# Patient Record
Sex: Male | Born: 1975 | Hispanic: Yes | Marital: Married | Smoking: Never smoker
Health system: Southern US, Community
[De-identification: ages and names within clinical notes are randomized; demographics above are authoritative.]

---

## 2017-12-19 ENCOUNTER — Emergency Department (HOSPITAL_BASED_OUTPATIENT_CLINIC_OR_DEPARTMENT_OTHER)
Admission: EM | Admit: 2017-12-19 | Discharge: 2017-12-19 | Disposition: A | Discharge status: Home / Self Care | Payer: Self-pay | Attending: Emergency Medicine | Admitting: Emergency Medicine

## 2017-12-19 ENCOUNTER — Other Ambulatory Visit: Payer: Self-pay

## 2017-12-19 ENCOUNTER — Encounter (HOSPITAL_BASED_OUTPATIENT_CLINIC_OR_DEPARTMENT_OTHER): Payer: Self-pay

## 2017-12-19 ENCOUNTER — Emergency Department (HOSPITAL_BASED_OUTPATIENT_CLINIC_OR_DEPARTMENT_OTHER): Payer: Self-pay

## 2017-12-19 DIAGNOSIS — N503 Cyst of epididymis: Secondary | ICD-10-CM

## 2017-12-19 DIAGNOSIS — N433 Hydrocele, unspecified: Secondary | ICD-10-CM

## 2017-12-19 DIAGNOSIS — N50811 Right testicular pain: Secondary | ICD-10-CM

## 2017-12-19 LAB — URINALYSIS, ROUTINE W REFLEX MICROSCOPIC
BILIRUBIN URINE: NEGATIVE
Glucose, UA: NEGATIVE mg/dL
HGB URINE DIPSTICK: NEGATIVE
Ketones, ur: NEGATIVE mg/dL
Leukocytes, UA: NEGATIVE
Nitrite: NEGATIVE
PH: 7 (ref 5.0–8.0)
PROTEIN: NEGATIVE mg/dL
SPECIFIC GRAVITY, URINE: 1.015 (ref 1.005–1.030)

## 2017-12-19 NOTE — ED Triage Notes (Signed)
Thru interpreter Melanee Spry [phone] with right testicle pain x 23 monthe-was seen once for pain and given pain meds-has had increase in pain today-denies dysuria and penile d/c-has noticed increase in urination-pt NAD-steady gait

## 2017-12-19 NOTE — ED Provider Notes (Signed)
[facility] EMERGENCY DEPARTMENT Provider Note   CSN: [id number] Arrival date & time: 12/19/17  1244     History   Chief Complaint Chief Complaint  Patient presents with  . Testicle Pain   [employer] interpreter used to obtain history HPI Benjamin Payne is a 42 y.o. male with no significant past medical history presents emergency department today for right testicular pain over the last 2-3 months.  Patient states that he typically gets daily testicular pain on the right side that occurs later in the afternoon after he is finished work.  He notes that the pain typically lasts for 1-2 hours and describes as a dull, achy, throbbing pain.  He does nothing makes his symptoms better or worse.  He has not tried anything for this.  Patient is currently sexually active with one male partner, his wife, he does not use protection.  He denies history of STDs or concern for STDs at this time.  He denies any fever, chills, abdominal pain, nausea/vomiting/diarrhea, painful bowel movements, penile discharge, bleeding or bleeding, rashes or lesions.  HPI  History reviewed. No pertinent past medical history.  There are no active problems to display for this patient.   History reviewed. No pertinent surgical history.      Home Medications    Prior to Admission medications   Not on File    Family History No family history on file.  Social History Social History   Tobacco Use  . Smoking status: Never Smoker  . Smokeless tobacco: Never Used  Substance Use Topics  . Alcohol use: Not Currently  . Drug use: Never     Allergies   Patient has no known allergies.   Review of Systems Review of Systems  All other systems reviewed and are negative.    Physical Exam Updated Vital Signs BP 119/68 (BP Location: Left Arm)   Pulse (!) 58   Temp 98.2 F (36.8 C) (Oral)   Resp 18   Wt 58.7 kg (129 lb 6.6 oz)   SpO2 100%   Physical Exam  Constitutional: He appears  well-developed and well-nourished.  HENT:  Head: Normocephalic and atraumatic.  Right Ear: External ear normal.  Left Ear: External ear normal.  Nose: Nose normal.  Mouth/Throat: Uvula is midline, oropharynx is clear and moist and mucous membranes are normal. No tonsillar exudate.  Eyes: Pupils are equal, round, and reactive to light. Right eye exhibits no discharge. Left eye exhibits no discharge. No scleral icterus.  Neck: Trachea normal. Neck supple. No spinous process tenderness present. No neck rigidity. Normal range of motion present.  Cardiovascular: Normal rate, regular rhythm and intact distal pulses.  No murmur heard. Pulses:      Radial pulses are 2+ on the right side, and 2+ on the left side.       Dorsalis pedis pulses are 2+ on the right side, and 2+ on the left side.       Posterior tibial pulses are 2+ on the right side, and 2+ on the left side.  No lower extremity swelling or edema. Calves symmetric in size bilaterally.  Pulmonary/Chest: Effort normal and breath sounds normal. He exhibits no tenderness.  Abdominal: Soft. Bowel sounds are normal. There is no tenderness. There is no rigidity, no rebound, no guarding and no CVA tenderness. Hernia confirmed negative in the right inguinal area and confirmed negative in the left inguinal area.  Genitourinary: Testes normal and penis normal. Right testis shows no mass, no swelling  and no tenderness. Left testis shows no mass, no swelling and no tenderness. Uncircumcised. No phimosis, paraphimosis, hypospadias, penile erythema or penile tenderness. No discharge found.  Genitourinary Comments: Chaperone present during genital exam. No external genital lesions noted, no bumps on head of penis, specifically no vesicles concerning for herpes or chancre suggestive of syphillis, no pain with palpation, no discharge or urethritis noted, scrotum and testicles w/o erythema or swelling, NTTP   Musculoskeletal: He exhibits no edema.    Lymphadenopathy:    He has no cervical adenopathy. No inguinal adenopathy noted on the right or left side.  Neurological: He is alert.  Skin: Skin is warm and dry. No rash noted. He is not diaphoretic.  Psychiatric: He has a normal mood and affect.  Nursing note and vitals reviewed.    ED Treatments / Results  Labs (all labs ordered are listed, but only abnormal results are displayed) Labs Reviewed  URINALYSIS, ROUTINE W REFLEX MICROSCOPIC  GC/CHLAMYDIA PROBE AMP ([place]) NOT AT [facility]    EKG None  Radiology US Scrotum W/doppler  Result Date: 12/19/2017 CLINICAL DATA:  Chronic RIGHT testicular pain increased today EXAM: SCROTAL ULTRASOUND DOPPLER ULTRASOUND OF THE TESTICLES TECHNIQUE: Complete ultrasound examination of the testicles, epididymis, and other scrotal structures was performed. Color and spectral Doppler ultrasound were also utilized to evaluate blood flow to the testicles. COMPARISON:  None FINDINGS: Right testicle Measurements: 5.1 x 2.5 x 3.7 cm. Normal morphology without mass or calcification. Internal blood flow present on color Doppler imaging. Left testicle Measurements: 5.1 x 2.3 x 3.1 cm. Normal morphology without mass or calcification. Internal blood flow present on color Doppler imaging. Right epididymis:  Normal in size and appearance. Left epididymis:  Tiny cyst at LEFT epididymal head 3 mm diameter Hydrocele:  Minimal LEFT hydrocele.  No RIGHT hydrocele Varicocele:  Absent bilaterally Pulsed Doppler interrogation of both testes demonstrates normal low resistance arterial and venous waveforms bilaterally. IMPRESSION: Minimal LEFT hydrocele and tiny LEFT epididymal head cyst 3 mm diameter. Otherwise normal exam. Electronically Signed   By: Ulyses Southward M.D.   On: 12/19/2017 15:26    Procedures Procedures (including critical care time)  Medications Ordered in ED Medications - No data to display   Initial Impression / Assessment and Plan / ED Course  I have  reviewed the triage vital signs and the nursing notes.  Pertinent labs & imaging results that were available during my care of the patient were reviewed by me and considered in my medical decision making (see chart for details).     42 year old male with intermittent right testicular pain over the last 2-3 months.  He notes that he gets this pain daily after he finishes work and it typically last 1-2 hours.  He notes some occasional swelling with this but no overlying erythema.  He denies any abdominal pain, fever, nausea/vomiting, painful bowel movements, pedal discharge.  He is sexually active in a monogamous relationship with his wife and does not use protection.  He has no concerns for STDs at this time.  Patient abdominal exam benign without any tenderness palpation or peritoneal signs.  GU exam reassuring as above.  There is no tenderness palpation of the testes or epididymis to suggest orchitis or epididymitis.  Patient denies any painful bowel movements of making concern for prostatitis.  Urinalysis without evidence of UTI.  Scrotal ultrasound without evidence of torsion.  There is minimal left hydrocele and tiny left epididymal head cysts measuring 3 mm. No abnormalities of the  right testicle where the patient is having pain. G/C collected and patient told he will be called with results. The evaluation does not show pathology that would require ongoing emergent intervention or inpatient treatment. I advised the patient to call for follow-up with Urologist this week. I advised the patient to return to the emergency department with new or worsening symptoms or new concerns. Specific return precautions discussed. The patient verbalized understanding and agreement with plan. All questions answered. No further questions at this time. The patient is hemodynamically stable, mentating appropriately and appears safe for discharge.  Final Clinical Impressions(s) / ED Diagnoses   Final diagnoses:  Right  testicular pain  Left hydrocele  Epididymal cyst    ED Discharge Orders    None       Jacinto Halim, Cordelia Poche 12/19/17 1658    Pricilla Loveless, MD 12/19/17 [phone]

## 2017-12-19 NOTE — ED Notes (Signed)
Patient transported to Ultrasound 

## 2017-12-19 NOTE — ED Notes (Signed)
Pt verbalizes understanding of d/c instructions and denies any further needs at this time. 

## 2017-12-19 NOTE — ED Notes (Signed)
Chaperone with PA for exam.

## 2017-12-19 NOTE — Discharge Instructions (Signed)
Your Ultrasound was reassuring. This showed a  LEFT hydrocele and tiny LEFT epididymal head cyst 3 mm diameter.  Your urinalysis did not show any evidence of infection.  Your gonorrhea and Chlamydia testing is pending.  You will receive a call in the next 48-72 hours.  I am referring you to a urologist for further work-up at this time. If you develop any acute worsening of testicular pain, worsening or new concerning symptoms you can return to the emergency department for re-evaluation.

## 2017-12-20 LAB — GC/CHLAMYDIA PROBE AMP (~~LOC~~) NOT AT ARMC
Chlamydia: NEGATIVE
NEISSERIA GONORRHEA: NEGATIVE

## 2019-07-11 IMAGING — US US SCROTUM W/ DOPPLER COMPLETE
1 series · 14 of 25 positions shown · non-contrast
Comparison: None

CLINICAL DATA: Chronic RIGHT testicular pain increased today

EXAM:
SCROTAL ULTRASOUND
DOPPLER ULTRASOUND OF THE TESTICLES
TECHNIQUE: Complete ultrasound examination of the testicles, epididymis, and
other scrotal structures was performed. Color and spectral Doppler
ultrasound were also utilized to evaluate blood flow to the
testicles.

[Series 1: us scrotum w/ doppler complete · 0.07mm/px · 14 of 63 slices shown]
[im 1/63]
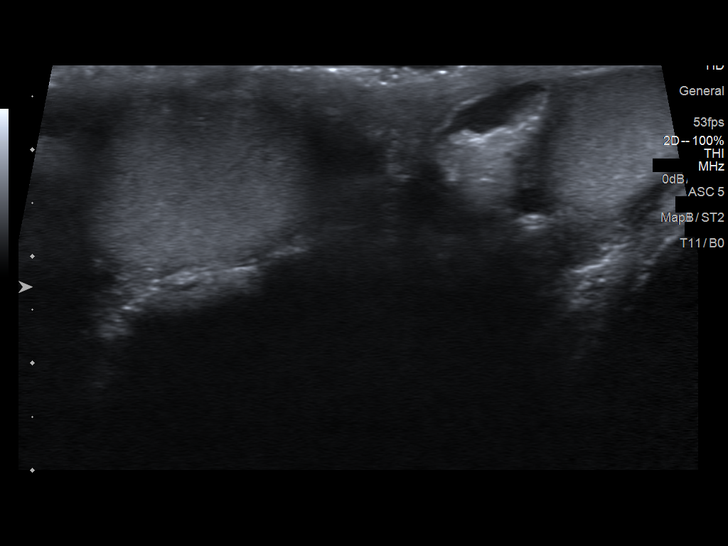
[im 6/63]
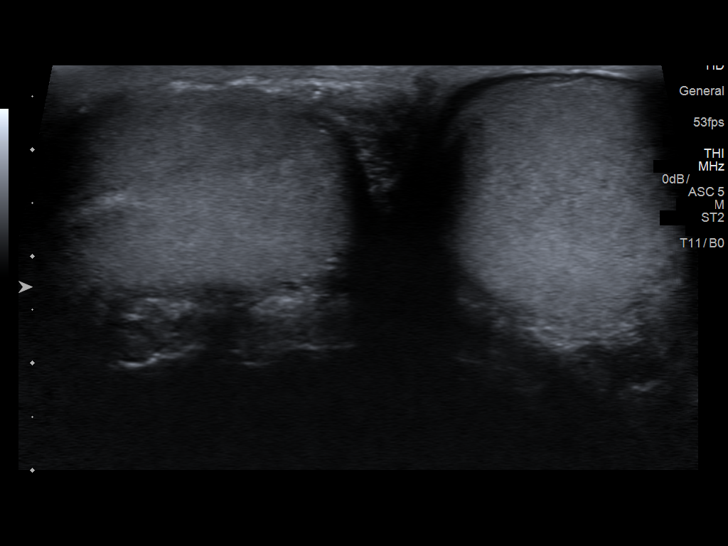
[im 11/63]
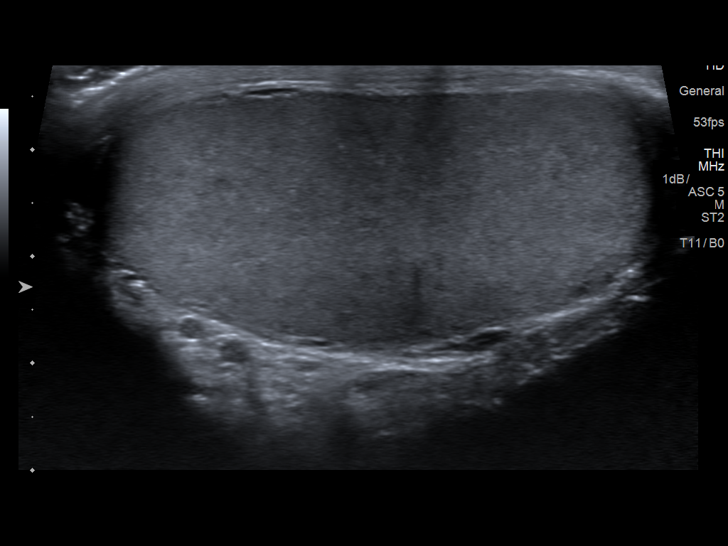
[im 16/63]
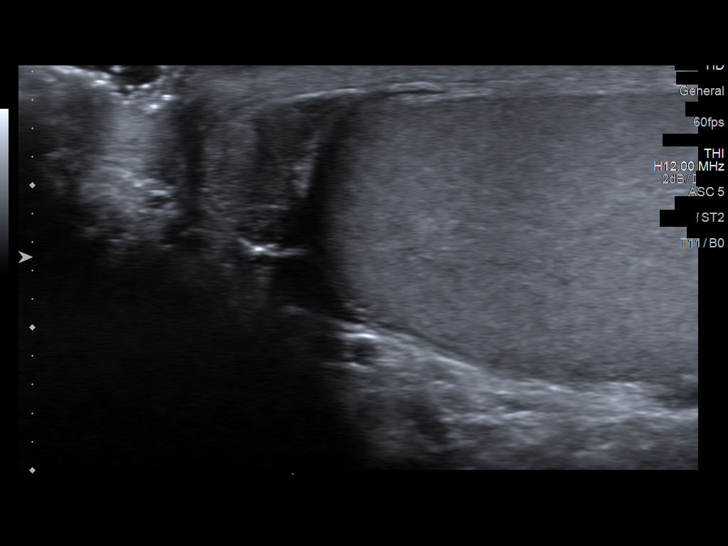
[im 21/63]
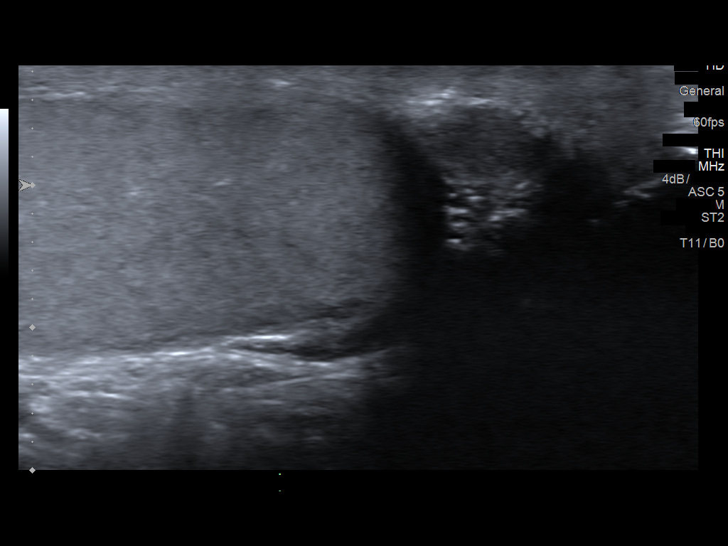
[im 24/63]
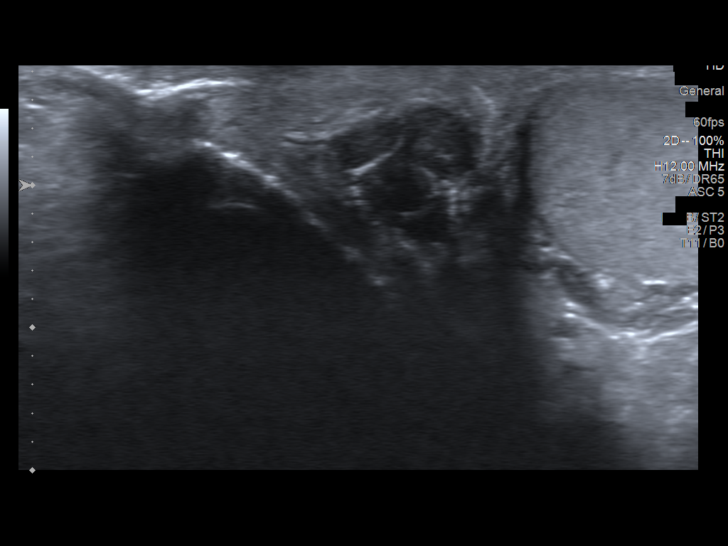
[im 29/63]
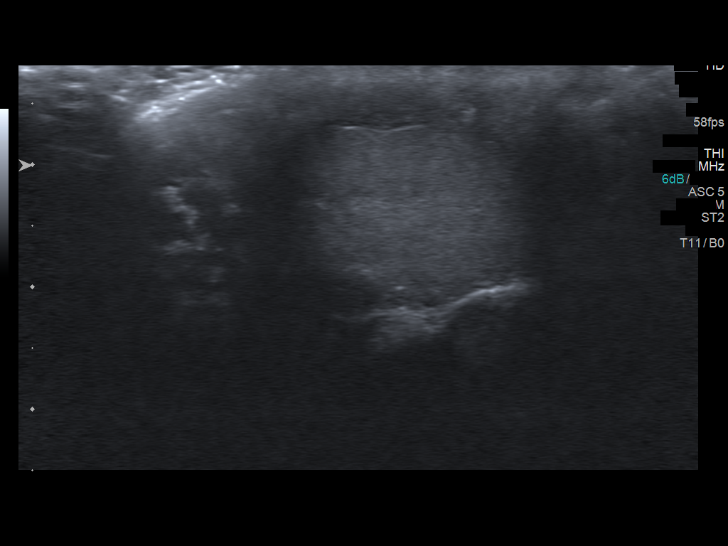
[im 34/63]
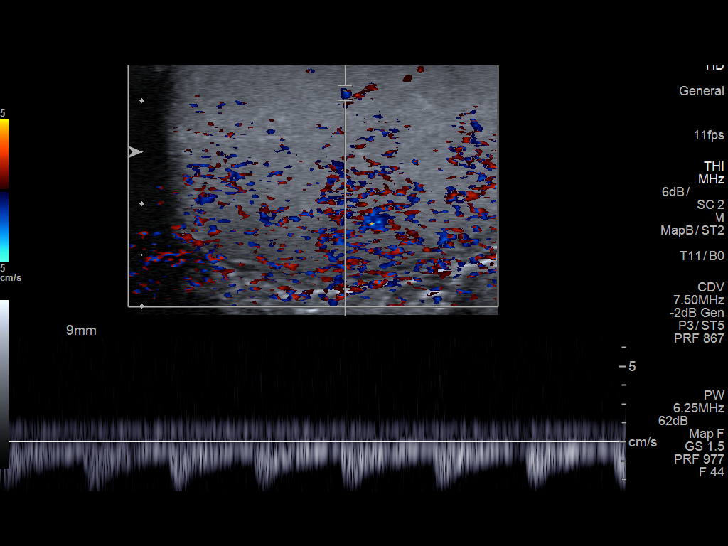
[im 39/63]
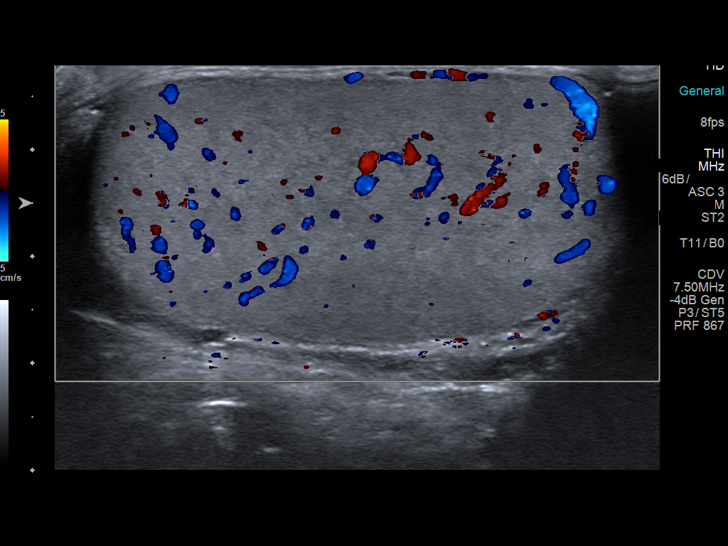
[im 42/63]
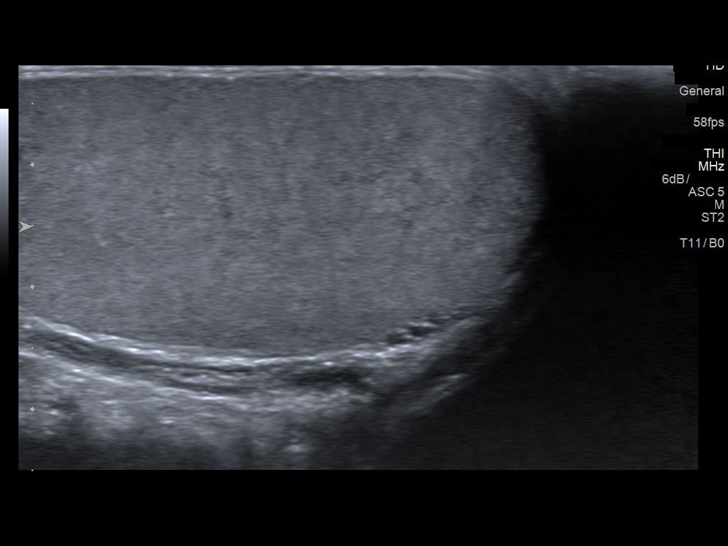
[im 47/63]
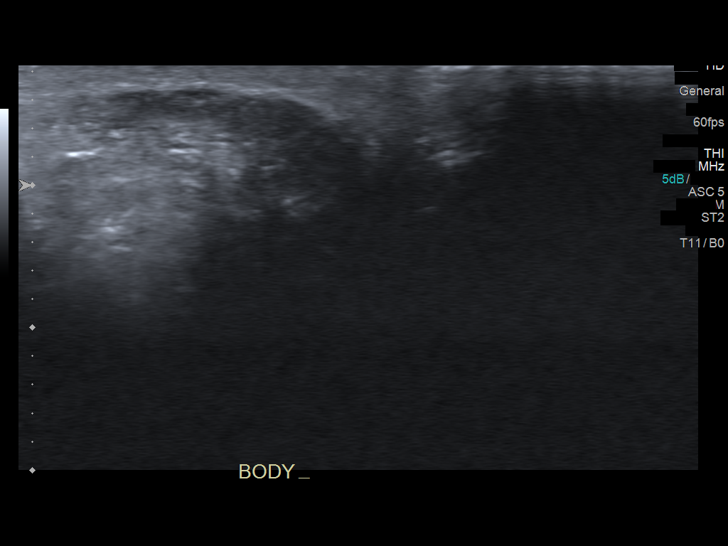
[im 52/63]
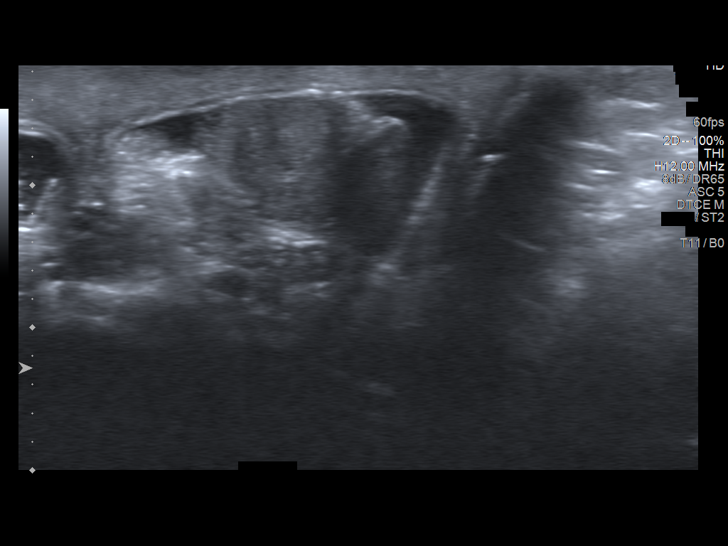
[im 57/63]
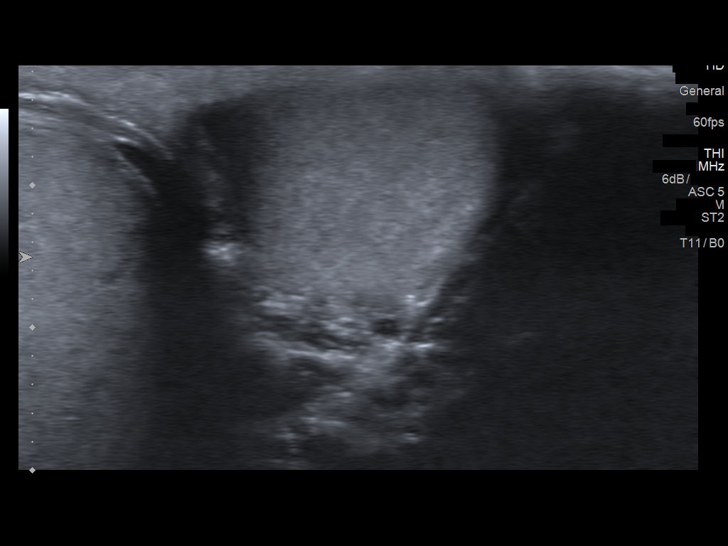
[im 63/63]
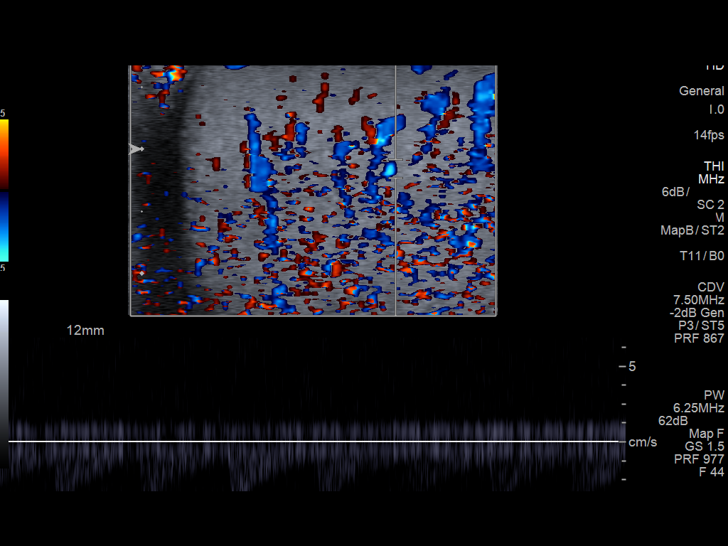

[14 of 25 positions shown; findings below may reference images not displayed]

FINDINGS: Right testicle

Measurements: 5.1 x 2.5 x 3.7 cm. Normal morphology without mass or
calcification. Internal blood flow present on color Doppler imaging.

Left testicle

Measurements: 5.1 x 2.3 x 3.1 cm. Normal morphology without mass or
calcification. Internal blood flow present on color Doppler imaging.

Right epididymis:  Normal in size and appearance.

Left epididymis:  Tiny cyst at LEFT epididymal head 3 mm diameter

Hydrocele:  Minimal LEFT hydrocele.  No RIGHT hydrocele

Varicocele:  Absent bilaterally

Pulsed Doppler interrogation of both testes demonstrates normal low
resistance arterial and venous waveforms bilaterally.
IMPRESSION: Minimal LEFT hydrocele and tiny LEFT epididymal head cyst 3 mm
diameter.

Otherwise normal exam.
